# Patient Record
Sex: Female | Born: 1995 | Race: White | Hispanic: No | Marital: Married | Smoking: Former smoker
Health system: Southern US, Community
[De-identification: ages and names within clinical notes are randomized; demographics above are authoritative.]

## PROBLEM LIST (undated history)

## (undated) ENCOUNTER — Inpatient Hospital Stay (HOSPITAL_COMMUNITY): Payer: Self-pay

## (undated) DIAGNOSIS — Z789 Other specified health status: Secondary | ICD-10-CM

## (undated) HISTORY — PX: NO PAST SURGERIES: SHX2092

---

## 2016-12-01 ENCOUNTER — Inpatient Hospital Stay (HOSPITAL_COMMUNITY)
Admission: AD | Admit: 2016-12-01 | Discharge: 2016-12-01 | Disposition: A | Discharge status: Home / Self Care | Payer: BLUE CROSS/BLUE SHIELD | Source: Ambulatory Visit | Attending: Obstetrics & Gynecology | Admitting: Obstetrics & Gynecology

## 2016-12-01 ENCOUNTER — Inpatient Hospital Stay (HOSPITAL_COMMUNITY): Payer: BLUE CROSS/BLUE SHIELD

## 2016-12-01 ENCOUNTER — Encounter (HOSPITAL_COMMUNITY): Payer: Self-pay

## 2016-12-01 DIAGNOSIS — O200 Threatened abortion: Secondary | ICD-10-CM | POA: Diagnosis not present

## 2016-12-01 DIAGNOSIS — Z87891 Personal history of nicotine dependence: Secondary | ICD-10-CM | POA: Diagnosis not present

## 2016-12-01 DIAGNOSIS — Z3A00 Weeks of gestation of pregnancy not specified: Secondary | ICD-10-CM | POA: Diagnosis not present

## 2016-12-01 HISTORY — DX: Other specified health status: Z78.9

## 2016-12-01 LAB — CBC WITH DIFFERENTIAL/PLATELET
BASOS ABS: 0 10*3/uL (ref 0.0–0.1)
Basophils Relative: 0 %
EOS PCT: 0 %
Eosinophils Absolute: 0 10*3/uL (ref 0.0–0.7)
HCT: 46.6 % — ABNORMAL HIGH (ref 36.0–46.0)
Hemoglobin: 16.1 g/dL — ABNORMAL HIGH (ref 12.0–15.0)
LYMPHS PCT: 48 %
Lymphs Abs: 2 10*3/uL (ref 0.7–4.0)
MCH: 30.8 pg (ref 26.0–34.0)
MCHC: 34.5 g/dL (ref 30.0–36.0)
MCV: 89.3 fL (ref 78.0–100.0)
MONO ABS: 0.3 10*3/uL (ref 0.1–1.0)
MONOS PCT: 6 %
Neutro Abs: 2 10*3/uL (ref 1.7–7.7)
Neutrophils Relative %: 46 %
PLATELETS: 178 10*3/uL (ref 150–400)
RBC: 5.22 MIL/uL — ABNORMAL HIGH (ref 3.87–5.11)
RDW: 13.2 % (ref 11.5–15.5)
WBC: 4.3 10*3/uL (ref 4.0–10.5)

## 2016-12-01 LAB — URINALYSIS, ROUTINE W REFLEX MICROSCOPIC
BACTERIA UA: NONE SEEN
Bilirubin Urine: NEGATIVE
Glucose, UA: NEGATIVE mg/dL
Ketones, ur: NEGATIVE mg/dL
Leukocytes, UA: NEGATIVE
NITRITE: NEGATIVE
PROTEIN: 100 mg/dL — AB
SPECIFIC GRAVITY, URINE: 1.027 (ref 1.005–1.030)
WBC UA: NONE SEEN WBC/hpf (ref 0–5)
pH: 5 (ref 5.0–8.0)

## 2016-12-01 LAB — ABO/RH: ABO/RH(D): O POS

## 2016-12-01 LAB — POCT PREGNANCY, URINE: PREG TEST UR: POSITIVE — AB

## 2016-12-01 LAB — HCG, QUANTITATIVE, PREGNANCY: hCG, Beta Chain, Quant, S: 305 m[IU]/mL — ABNORMAL HIGH (ref <5)

## 2016-12-01 NOTE — Discharge Instructions (Signed)
Miscarriage °A miscarriage is the loss of an unborn baby (fetus) before the 20th week of pregnancy. The cause is often unknown. °Follow these instructions at home: °· You may need to stay in bed (bed rest), or you may be able to do light activity. Go about activity as told by your doctor. °· Have help at home. °· Write down how many pads you use each day. Write down how soaked they are. °· Do not use tampons. Do not wash out your vagina (douche) or have sex (intercourse) until your doctor approves. °· Only take medicine as told by your doctor. °· Do not take aspirin. °· Keep all doctor visits as told. °· If you or your partner have problems with grieving, talk to your doctor. You can also try counseling. Give yourself time to grieve before trying to get pregnant again. °Get help right away if: °· You have bad cramps or pain in your back or belly (abdomen). °· You have a fever. °· You pass large clumps of blood (clots) from your vagina that are walnut-sized or larger. Save the clumps for your doctor to see. °· You pass large amounts of tissue from your vagina. Save the tissue for your doctor to see. °· You have more bleeding. °· You have thick, bad-smelling fluid (discharge) coming from the vagina. °· You get lightheaded, weak, or you pass out (faint). °· You have chills. °This information is not intended to replace advice given to you by your health care provider. Make sure you discuss any questions you have with your health care provider. °Document Released: 02/04/2012 Document Revised: 04/19/2016 Document Reviewed: 12/13/2011 °Elsevier Interactive Patient Education © 2017 [employer] ° ° °Threatened Miscarriage °A threatened miscarriage is when you have vaginal bleeding during your first 20 weeks of pregnancy but the pregnancy has not ended. Your doctor will do tests to make sure you are still pregnant. The cause of the bleeding may not be known. This condition does not mean your pregnancy will end. It does  increase the risk of it ending (complete miscarriage). °Follow these instructions at home: °· Make sure you keep all your doctor visits for prenatal care. °· Get plenty of rest. °· Do not have sex or use tampons if you have vaginal bleeding. °· Do not douche. °· Do not smoke or use drugs. °· Do not drink alcohol. °· Avoid caffeine. °Contact a doctor if: °· You have light bleeding from your vagina. °· You have belly pain or cramping. °· You have a fever. °Get help right away if: °· You have heavy bleeding from your vagina. °· You have clots of blood coming from your vagina. °· You have bad pain or cramps in your low back or belly. °· You have fever, chills, and bad belly pain. °This information is not intended to replace advice given to you by your health care provider. Make sure you discuss any questions you have with your health care provider. °Document Released: 10/25/2008 Document Revised: 04/19/2016 Document Reviewed: 09/08/2013 °Elsevier Interactive Patient Education © 2017 [employer] ° °

## 2016-12-01 NOTE — MAU Provider Note (Signed)
History   G1 very early pregnant in with bleeding, cramping and passing tissue at home.   CSN: [id number]  Arrival date & time 12/01/16  1334   None     Chief Complaint  Patient presents with  . Threatened Miscarriage    HPI  Past Medical History:  Diagnosis Date  . Medical history non-contributory     Past Surgical History:  Procedure Laterality Date  . NO PAST SURGERIES      No family history on file.  Social History  Substance Use Topics  . Smoking status: Former Smoker    Types: Cigarettes    Quit date: 09/26/2016  . Smokeless tobacco: Never Used  . Alcohol use No    OB History    Gravida Para Term Preterm AB Living   1             SAB TAB Ectopic Multiple Live Births                  Review of Systems  Constitutional: Negative.   HENT: Negative.   Eyes: Negative.   Respiratory: Negative.   Cardiovascular: Negative.   Gastrointestinal: Positive for abdominal pain.  Endocrine: Negative.   Genitourinary: Positive for vaginal bleeding.  Musculoskeletal: Negative.   Skin: Negative.   Allergic/Immunologic: Negative.   Neurological: Negative.   Hematological: Negative.   Psychiatric/Behavioral: Negative.     Allergies  Patient has no known allergies.  Home Medications    BP 123/77 (BP Location: Right Arm)   Pulse 88   Temp 98 F (36.7 C) (Oral)   Resp 16   Ht 5\' 5"  (1.651 m)   Wt 152 lb (68.9 kg)   LMP 10/17/2016   BMI 25.29 kg/m   Physical Exam  Constitutional: She is oriented to person, place, and time. She appears well-developed and well-nourished.  HENT:  Head: Normocephalic.  Eyes: Pupils are equal, round, and reactive to light.  Neck: Normal range of motion.  Cardiovascular: Normal rate, regular rhythm, normal heart sounds and intact distal pulses.   Pulmonary/Chest: Effort normal and breath sounds normal.  Abdominal: Soft. Bowel sounds are normal.  Genitourinary: Vagina normal and uterus normal.  Musculoskeletal: Normal range  of motion.  Neurological: She is alert and oriented to person, place, and time. She has normal reflexes.  Skin: Skin is warm and dry.  Psychiatric: She has a normal mood and affect. Her behavior is normal. Judgment and thought content normal.    MAU Course  Procedures (including critical care time)  Labs Reviewed  CBC WITH DIFFERENTIAL/PLATELET - Abnormal; Notable for the following:       Result Value   RBC 5.22 (*)    Hemoglobin 16.1 (*)    HCT 46.6 (*)    All other components within normal limits  HCG, QUANTITATIVE, PREGNANCY - Abnormal; Notable for the following:    hCG, Beta Chain, Quant, S 305 (*)    All other components within normal limits  URINALYSIS, ROUTINE W REFLEX MICROSCOPIC - Abnormal; Notable for the following:    Color, Urine AMBER (*)    APPearance CLOUDY (*)    Hgb urine dipstick LARGE (*)    Protein, ur 100 (*)    Squamous Epithelial / LPF 0-5 (*)    All other components within normal limits  POCT PREGNANCY, URINE - Abnormal; Notable for the following:    Preg Test, Ur POSITIVE (*)    All other components within normal limits  ABO/RH   [place] Ob  Comp Less 14 Wks  Result Date: 12/01/2016 CLINICAL DATA:  Pain and vaginal bleeding. First-trimester pregnancy. Quantitative beta HCG 305. EXAM: OBSTETRIC <14 WK ULTRASOUND TECHNIQUE: Transabdominal ultrasound was performed for evaluation of the gestation as well as the maternal uterus and adnexal regions. COMPARISON:  None. FINDINGS: No definitive intra or extrauterine gestational sac. No adnexal mass or pelvic fluid. There is a small endometrial cystic structure with irregular shape and no convincing decidual reaction measuring 3.2 mm. If the gestational sac this would correlate with [redacted] week gestational age. Normal appearance of the ovaries. IMPRESSION: Pregnancy of unknown location with 3 mm cystic structure in the endometrium which is not definitive for gestation sac. Differential considerations at the current beta include  early intrauterine gestation, spontaneous abortion, or ectopic pregnancy. Consider follow-up ultrasound in 10 days and serial quantitative beta HCG follow-up. Electronically Signed   By: Marnee Spring M.D.   On: 12/01/2016 15:48     1. Threatened abortion in early pregnancy       MDM  Sterile spec exam done sm-mod amt bright vag bleeding with sm clots noted. Quant 305, [place] not definitive. S and S of ectopic vs threat ab discussed with pt and mother both verbalize understanding. Message sent to clinic for pt to have follow up quant in clinic on Monday and counseling. Will d/c home is stable condition.

## 2016-12-01 NOTE — MAU Note (Signed)
Positive UPT at home on Christmas day, yesterday morning noticed light bleeding, now heavy like a period, having lower back pain and cramping, passing tissue. Lower back pain 6/10

## 2016-12-03 ENCOUNTER — Other Ambulatory Visit: Payer: BLUE CROSS/BLUE SHIELD

## 2016-12-03 LAB — GC/CHLAMYDIA PROBE AMP (~~LOC~~) NOT AT ARMC
Chlamydia: NEGATIVE
NEISSERIA GONORRHEA: NEGATIVE

## 2016-12-04 ENCOUNTER — Telehealth: Payer: Self-pay | Admitting: *Deleted

## 2016-12-04 LAB — HCG, QUANTITATIVE, PREGNANCY: HCG, BETA CHAIN, QUANT, S: 109.3 m[IU]/mL — AB

## 2016-12-04 NOTE — Telephone Encounter (Signed)
[facility] Penne LashLeggett[facility]3 which is consistent with miscarriage. She will need appt in 2 weeks for F/u SAB and to discuss birth control. Pt was called and notified of test results as well as recommendation for follow up. She will be called with appt details. Pt voiced understanding and agreed to plan of care.

## 2016-12-18 ENCOUNTER — Encounter: Payer: BLUE CROSS/BLUE SHIELD | Admitting: Advanced Practice Midwife

## 2017-06-10 IMAGING — US US OB COMP LESS 14 WK
1 series · 15 of 28 positions shown · non-contrast
Comparison: None.

CLINICAL DATA: Pain and vaginal bleeding. First-trimester
pregnancy. Quantitative beta HCG 305.

EXAM:
OBSTETRIC <14 WK ULTRASOUND
TECHNIQUE: Transabdominal ultrasound was performed for evaluation of the
gestation as well as the maternal uterus and adnexal regions.

[Series 1: us ob comp less 14 wk · 15 of 37 slices shown]
[im 1/37]
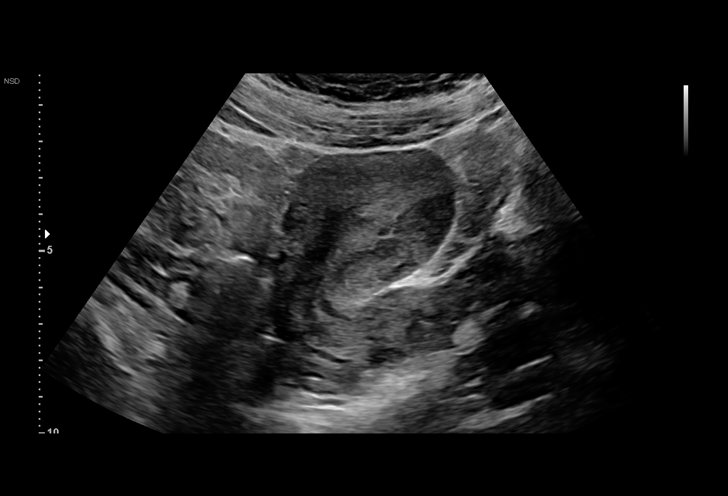
[im 3/37]
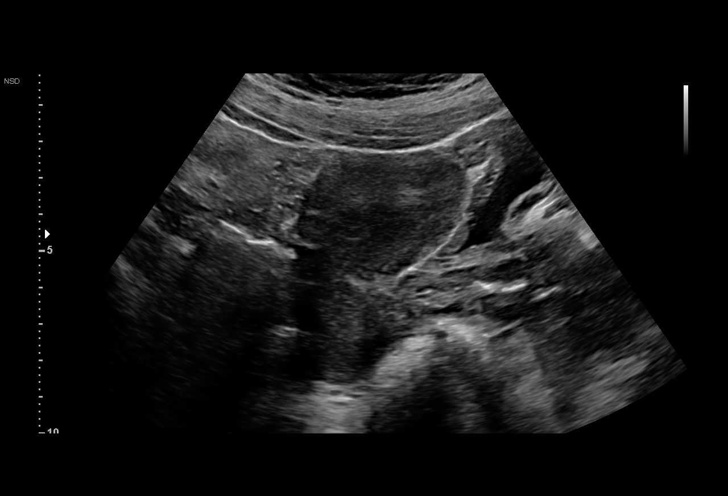
[im 6/37]
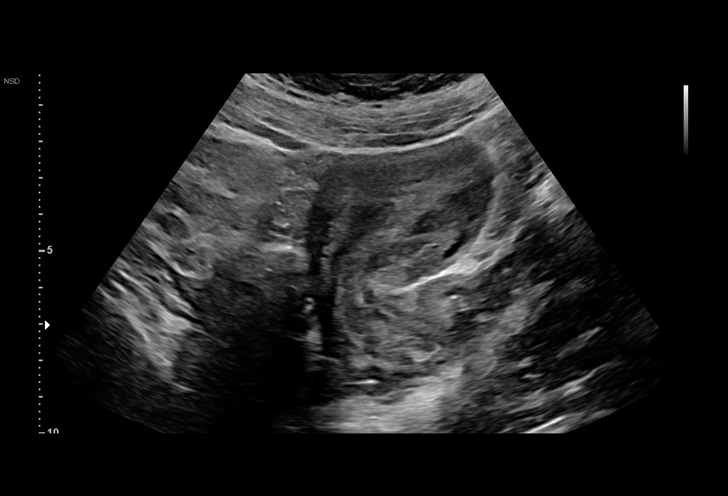
[im 9/37]
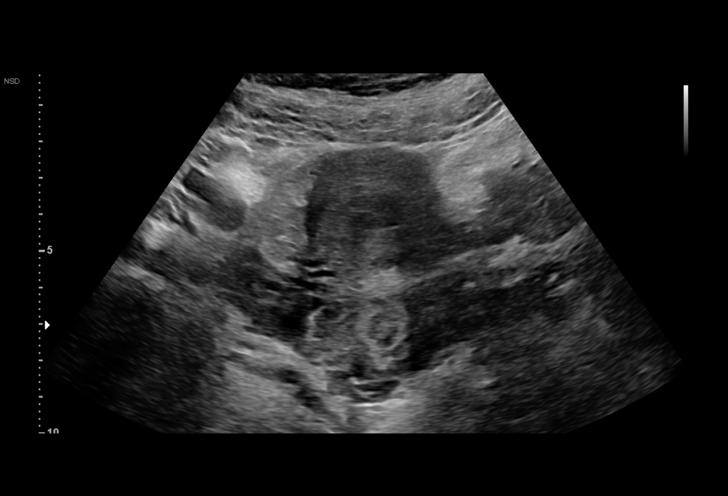
[im 11/37]
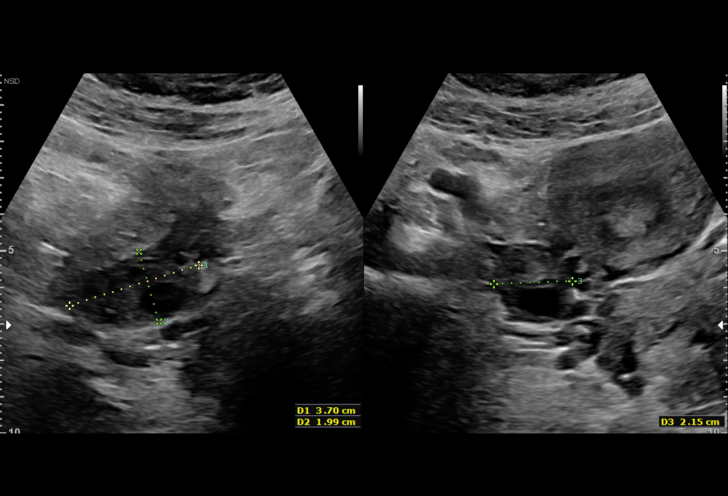
[im 14/37]
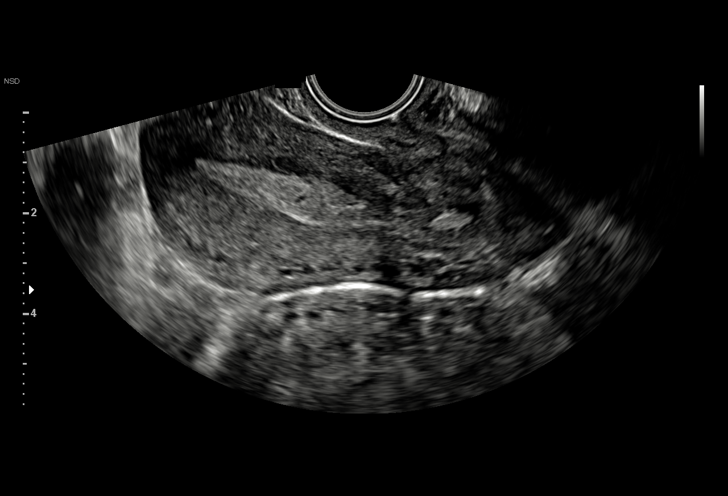
[im 17/37]
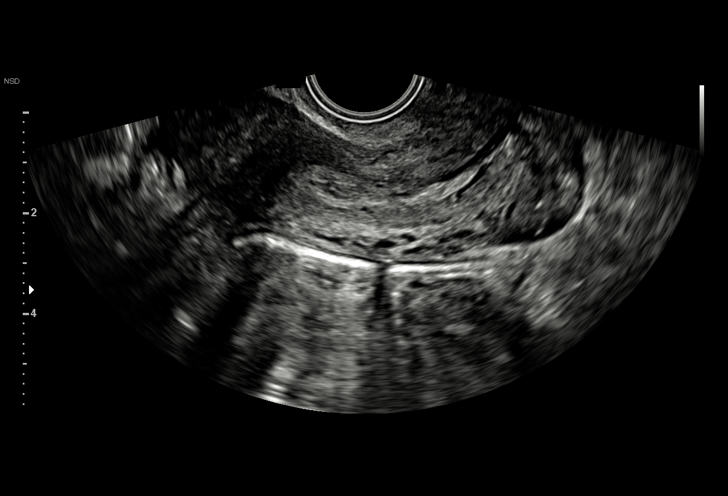
[im 19/37]
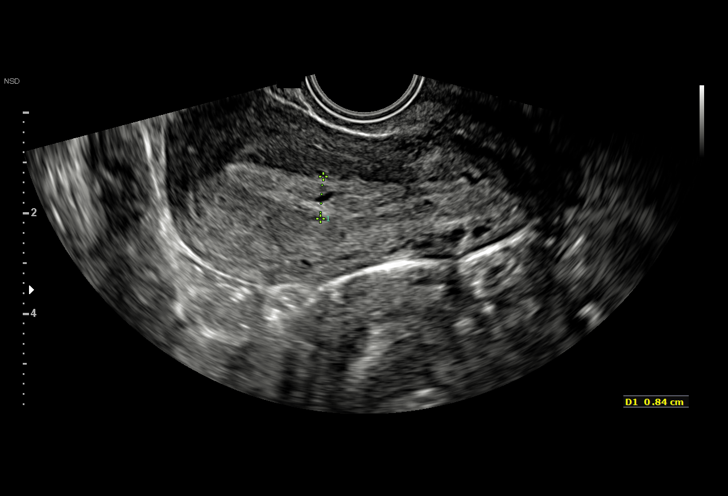
[im 21/37]
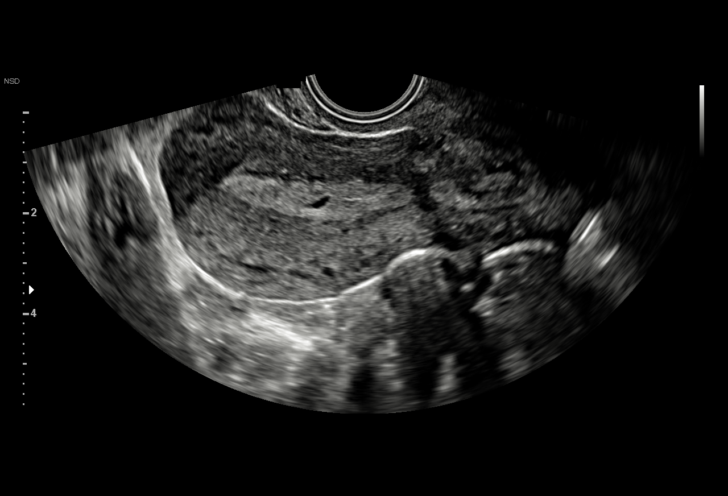
[im 23/37]
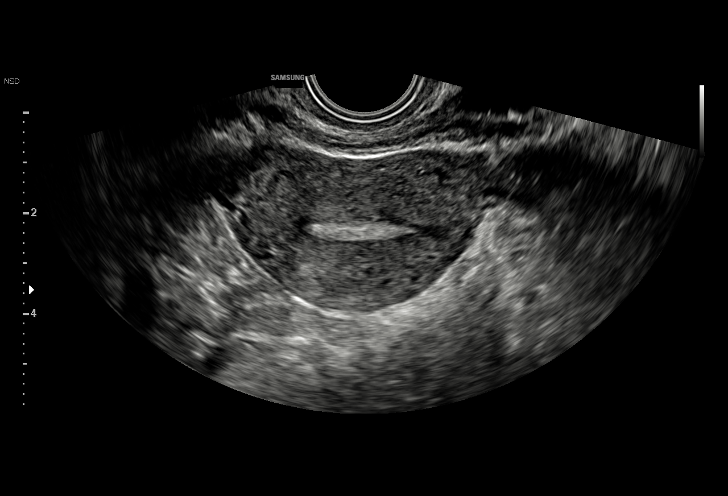
[im 26/37]
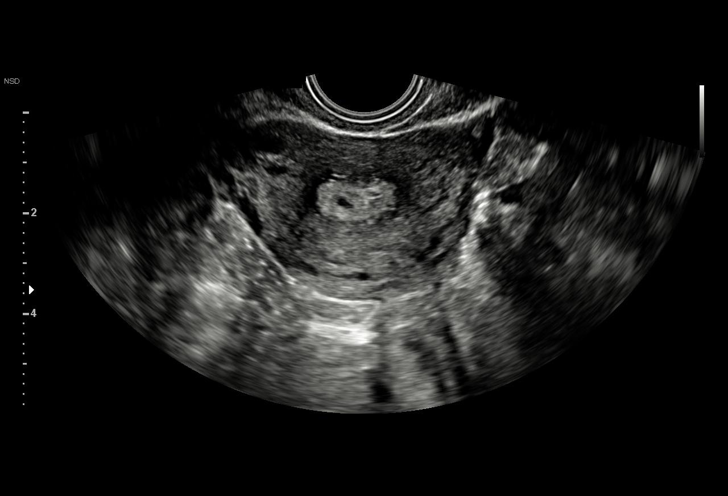
[im 29/37]
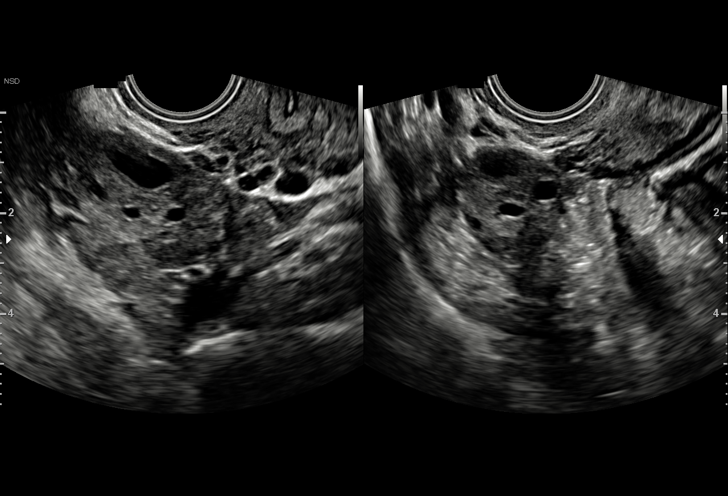
[im 31/37]
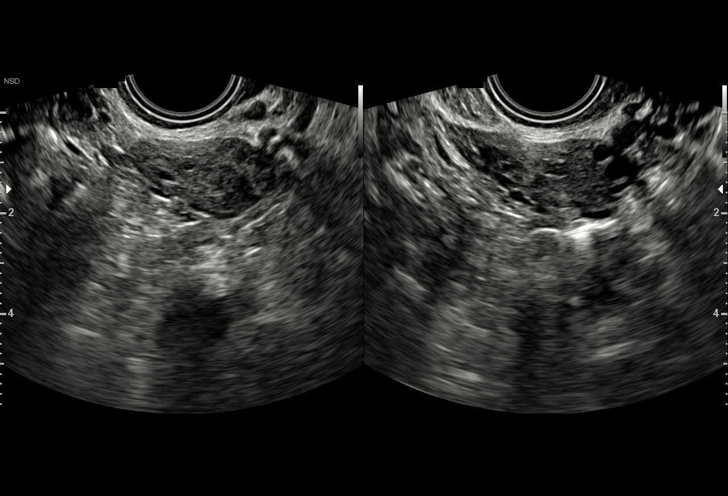
[im 34/37]
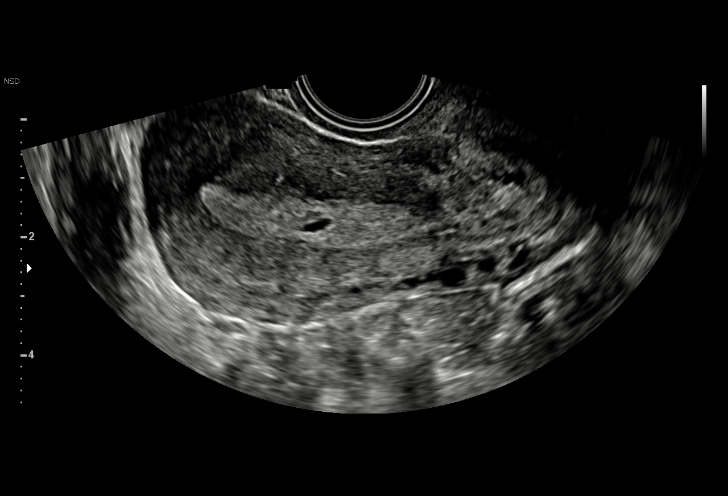
[im 37/37]
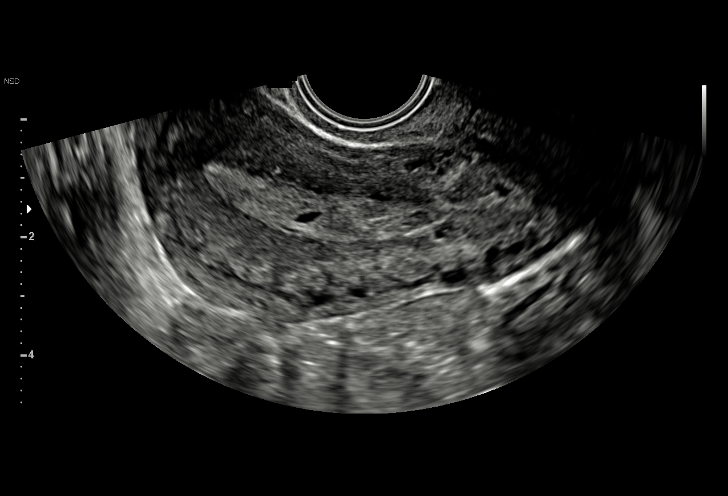

[15 of 28 positions shown; findings below may reference images not displayed]

FINDINGS: No definitive intra or extrauterine gestational sac. No adnexal mass
or pelvic fluid. There is a small endometrial cystic structure with
irregular shape and no convincing decidual reaction measuring
mm. If the gestational sac this would correlate with 5 week
gestational age. Normal appearance of the ovaries.
IMPRESSION: Pregnancy of unknown location with 3 mm cystic structure in the
endometrium which is not definitive for gestation sac.. Differential
considerations at the current beta include early intrauterine
gestation, spontaneous abortion, or ectopic pregnancy. Consider
follow-up ultrasound in 10 days and serial quantitative beta HCG
follow-up.
